# Patient Record
Sex: Male | Born: 1945 | Race: White | Hispanic: No | Marital: Married | Smoking: Former smoker
Health system: Southern US, Community
[De-identification: ages and names within clinical notes are randomized; demographics above are authoritative.]

## PROBLEM LIST (undated history)

## (undated) DIAGNOSIS — J449 Chronic obstructive pulmonary disease, unspecified: Secondary | ICD-10-CM

## (undated) DIAGNOSIS — J45909 Unspecified asthma, uncomplicated: Secondary | ICD-10-CM

## (undated) DIAGNOSIS — I10 Essential (primary) hypertension: Secondary | ICD-10-CM

## (undated) DIAGNOSIS — T8859XA Other complications of anesthesia, initial encounter: Secondary | ICD-10-CM

## (undated) DIAGNOSIS — J189 Pneumonia, unspecified organism: Secondary | ICD-10-CM

## (undated) DIAGNOSIS — K219 Gastro-esophageal reflux disease without esophagitis: Secondary | ICD-10-CM

## (undated) HISTORY — PX: COLONOSCOPY: SHX174

## (undated) HISTORY — DX: Chronic obstructive pulmonary disease, unspecified: J44.9

## (undated) HISTORY — PX: ESOPHAGOGASTRODUODENOSCOPY: SHX1529

## (undated) HISTORY — PX: FRACTURE SURGERY: SHX138

## (undated) HISTORY — PX: TONSILLECTOMY: SUR1361

## (undated) HISTORY — DX: Gastro-esophageal reflux disease without esophagitis: K21.9

---

## 1998-01-12 HISTORY — PX: SINUS EXPLORATION: SHX5214

## 2004-08-01 ENCOUNTER — Ambulatory Visit: Payer: Self-pay | Admitting: Unknown Physician Specialty

## 2005-02-05 ENCOUNTER — Ambulatory Visit: Payer: Self-pay | Admitting: Unknown Physician Specialty

## 2005-02-27 ENCOUNTER — Ambulatory Visit: Payer: Self-pay | Admitting: Unknown Physician Specialty

## 2005-04-18 ENCOUNTER — Emergency Department: Payer: Self-pay | Admitting: Emergency Medicine

## 2009-03-29 ENCOUNTER — Ambulatory Visit: Payer: Self-pay | Admitting: Unknown Physician Specialty

## 2009-04-07 ENCOUNTER — Ambulatory Visit: Payer: Self-pay | Admitting: Internal Medicine

## 2009-04-07 ENCOUNTER — Inpatient Hospital Stay: Payer: Self-pay | Admitting: Internal Medicine

## 2009-12-24 ENCOUNTER — Ambulatory Visit: Payer: Self-pay | Admitting: Internal Medicine

## 2009-12-25 ENCOUNTER — Ambulatory Visit: Payer: Self-pay | Admitting: Internal Medicine

## 2010-02-25 ENCOUNTER — Encounter: Payer: Self-pay | Admitting: Specialist

## 2010-03-13 ENCOUNTER — Encounter: Payer: Self-pay | Admitting: Specialist

## 2010-04-13 ENCOUNTER — Encounter: Payer: Self-pay | Admitting: Specialist

## 2010-05-13 ENCOUNTER — Encounter: Payer: Self-pay | Admitting: Specialist

## 2010-06-13 ENCOUNTER — Encounter: Payer: Self-pay | Admitting: Specialist

## 2010-11-07 ENCOUNTER — Ambulatory Visit: Payer: Self-pay | Admitting: Internal Medicine

## 2011-09-04 ENCOUNTER — Ambulatory Visit: Payer: Self-pay | Admitting: Unknown Physician Specialty

## 2011-09-07 LAB — PATHOLOGY REPORT

## 2011-10-19 ENCOUNTER — Ambulatory Visit: Payer: Self-pay | Admitting: Specialist

## 2011-10-19 LAB — CREATININE, SERUM: EGFR (Non-African Amer.): >60

## 2012-04-18 ENCOUNTER — Ambulatory Visit: Payer: Self-pay | Admitting: Specialist

## 2012-08-25 ENCOUNTER — Ambulatory Visit: Payer: Self-pay

## 2012-11-29 ENCOUNTER — Ambulatory Visit: Payer: Self-pay | Admitting: Specialist

## 2012-12-13 ENCOUNTER — Ambulatory Visit: Payer: Self-pay | Admitting: Specialist

## 2013-06-02 ENCOUNTER — Ambulatory Visit: Payer: Self-pay | Admitting: Unknown Physician Specialty

## 2013-06-14 LAB — PATHOLOGY REPORT

## 2013-06-30 ENCOUNTER — Ambulatory Visit: Payer: Self-pay | Admitting: Internal Medicine

## 2013-07-11 ENCOUNTER — Ambulatory Visit: Payer: Self-pay | Admitting: Surgery

## 2013-07-11 LAB — CBC
HCT: 39.6 % — ABNORMAL LOW (ref 40.0–52.0)
HGB: 13.2 g/dL (ref 13.0–18.0)
MCH: 27.2 pg (ref 26.0–34.0)
MCHC: 33.4 g/dL (ref 32.0–36.0)
MCV: 81 fL (ref 80–100)
Platelet: 207 10*3/uL (ref 150–440)
RBC: 4.87 10*6/uL (ref 4.40–5.90)
RDW: 13.4 % (ref 11.5–14.5)
WBC: 9.1 10*3/uL (ref 3.8–10.6)

## 2013-07-11 LAB — BASIC METABOLIC PANEL
Anion Gap: 5 — ABNORMAL LOW (ref 7–16)
BUN: 13 mg/dL (ref 7–18)
CREATININE: 0.89 mg/dL (ref 0.60–1.30)
Calcium, Total: 8.7 mg/dL (ref 8.5–10.1)
Chloride: 106 mmol/L (ref 98–107)
Co2: 27 mmol/L (ref 21–32)
EGFR (African American): >60
EGFR (Non-African Amer.): >60
Glucose: 86 mg/dL (ref 65–99)
Osmolality: 275 (ref 275–301)
POTASSIUM: 3.9 mmol/L (ref 3.5–5.1)
Sodium: 138 mmol/L (ref 136–145)

## 2014-01-12 HISTORY — PX: HERNIA REPAIR: SHX51

## 2016-04-16 ENCOUNTER — Ambulatory Visit (INDEPENDENT_AMBULATORY_CARE_PROVIDER_SITE_OTHER): Payer: Medicare Other | Admitting: General Surgery

## 2016-04-16 ENCOUNTER — Encounter: Payer: Self-pay | Admitting: General Surgery

## 2016-04-16 VITALS — BP 138/78 | HR 79 | Resp 18 | Ht 69.0 in | Wt 155.0 lb

## 2016-04-16 DIAGNOSIS — Z9981 Dependence on supplemental oxygen: Secondary | ICD-10-CM | POA: Diagnosis not present

## 2016-04-16 DIAGNOSIS — K4090 Unilateral inguinal hernia, without obstruction or gangrene, not specified as recurrent: Secondary | ICD-10-CM

## 2016-04-16 NOTE — Patient Instructions (Addendum)
The patient is aware to call back for any questions or concerns.  Inguinal Hernia, Adult An inguinal hernia is when fat or the intestines push through the area where the leg meets the lower belly (groin) and make a rounded lump (bulge). This condition happens over time. There are three types of inguinal hernias. These types include:  Hernias that can be pushed back into the belly (are reducible).  Hernias that cannot be pushed back into the belly (are incarcerated).  Hernias that cannot be pushed back into the belly and lose their blood supply (get strangulated). This type needs emergency surgery. Follow these instructions at home: Lifestyle   Drink enough fluid to keep your urine (pee) clear or pale yellow.  Eat plenty of fruits, vegetables, and whole grains. These have a lot of fiber. Talk with your doctor if you have questions.  Avoid lifting heavy objects.  Avoid standing for long periods of time.  Do not use tobacco products. These include cigarettes, chewing tobacco, or e-cigarettes. If you need help quitting, ask your doctor.  Try to stay at a healthy weight. General instructions   Do not try to force the hernia back in.  Watch your hernia for any changes in color or size. Let your doctor know if there are any changes.  Take over-the-counter and prescription medicines only as told by your doctor.  Keep all follow-up visits as told by your doctor. This is important. Contact a doctor if:  You have a fever.  You have new symptoms.  Your symptoms get worse. Get help right away if:  The area where the legs meets the lower belly has:  Pain that gets worse suddenly.  A bulge that gets bigger suddenly and does not go down.  A bulge that turns red or purple.  A bulge that is painful to the touch.  You are a man and your scrotum:  Suddenly feels painful.  Suddenly changes in size.  You feel sick to your stomach (nauseous) and this feeling does not go  away.  You throw up (vomit) and this keeps happening.  You feel your heart beating a lot more quickly than normal.  You cannot poop (have a bowel movement) or pass gas. This information is not intended to replace advice given to you by your health care provider. Make sure you discuss any questions you have with your health care provider. Document Released: 01/29/2006 Document Revised: 06/06/2015 Document Reviewed: 11/08/2013 Elsevier Interactive Patient Education  2017 Elsevier Inc.  

## 2016-04-16 NOTE — Progress Notes (Signed)
Patient ID: Erik Underwood, male   DOB: 11-Sep-1945, 71 y.o.   MRN: [id number]  Chief Complaint  Patient presents with  . Other    inguinal hernia    HPI [name] is a 71 y.o. male.  Here today for evaluation of a possible left inguinal hernia. He first noticed this about 3 years ago. He did not have an incident that happened but he does physical work daily. 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  He has not ridden in several years secondary to his oxygen tank.  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 He reports that area has enlarged and the pain has worsened.   The patient reports he had a right inguinal hernia repair under local anesthesia with sedation 2016 completed at [facility].  HPI  Past Medical History:  Diagnosis Date  . COPD (chronic obstructive pulmonary disease) (HCC)   . GERD (gastroesophageal reflux disease)     Past Surgical History:  Procedure Laterality Date  . HERNIA REPAIR Right 2016   [facility]  . SINUS EXPLORATION  2000    Family History  Problem Relation Age of Onset  . Breast cancer Mother   . Breast cancer Sister     Social History Social History  Substance Use Topics  . Smoking status: Former Smoker    Years: 15.00    Quit date: 01/13/1972  . Smokeless tobacco: Never Used  . Alcohol use Yes    No Known Allergies  Current Outpatient Prescriptions  Medication Sig Dispense Refill  . albuterol (PROVENTIL HFA;VENTOLIN HFA) 108 (90 Base) MCG/ACT inhaler Inhale 2 puffs into the lungs every 6 (six) hours as needed.     . fluticasone (FLONASE) 50 MCG/ACT nasal spray Place 2 sprays into both nostrils at bedtime.     . fluticasone furoate-vilanterol (BREO ELLIPTA) 100-25 MCG/INH AEPB Inhale 1 puff into the lungs daily.     . OXYGEN Inhale 4 L into the lungs.    . Red Yeast Rice Extract 600 MG CAPS Take 1,200 mg by mouth 2 (two)  times daily.     . sucralfate (CARAFATE) 1 g tablet Take 1 g by mouth 2 (two) times daily.     Marland Kitchen acetaminophen (TYLENOL) 650 MG CR tablet Take 650 mg by mouth 3 (three) times daily as needed (for pain (patient takes with tramadol)).    . Aclidinium Bromide (TUDORZA PRESSAIR) 400 MCG/ACT AEPB Inhale 1 puff into the lungs 2 (two) times daily.    . B Complex Vitamins (B COMPLEX 100 PO) Take 1 capsule by mouth daily.    . Coenzyme Q10 (CO Q 10) 100 MG CAPS Take 100 mg by mouth daily.    . Ginkgo Biloba (GINKOBA PO) Take 120 mg by mouth daily.    Marland Kitchen GLUCOSAMINE HCL-MSM PO Take 1 tablet by mouth 2 (two) times daily. 1500 mg    . Lecithin 1200 MG CAPS Take 1,200 mg by mouth daily.    Marland Kitchen MAGNESIUM CITRATE PO Take 1 capsule by mouth at bedtime.    . Omega-3 Fatty Acids (FISH OIL) 1000 MG CAPS Take 1,000 mg by mouth daily.    . pantoprazole (PROTONIX) 40 MG tablet Take 40 mg by mouth 2 (two) times daily.    Bertram Gala Glycol-Propyl Glycol (LUBRICANT EYE DROPS) 0.4-0.3 % SOLN Place 1-2 drops into both eyes 3 (  three) times daily as needed (for dry/irritated eyes.).    Marland Kitchen Saw Palmetto, Serenoa repens, (SAW PALMETTO PO) Take 1 capsule by mouth 4 (four) times daily.    . traMADol (ULTRAM) 50 MG tablet Take 25-50 mg by mouth 3 (three) times daily as needed (for pain.).    Marland Kitchen vitamin C (ASCORBIC ACID) 500 MG tablet Take 500 mg by mouth daily.     No current facility-administered medications for this visit.     Review of Systems Review of Systems  Constitutional: Negative.   Respiratory: Negative.   Cardiovascular: Negative.   Gastrointestinal: Negative.     Blood pressure 138/78, pulse 79, resp. rate 18, height  (1.753 m), weight 155 lb (70.3 kg).  Physical Exam Physical Exam  Constitutional: He is oriented to person, place, and time. He appears well-developed and well-nourished.  Cardiovascular: Normal rate, regular rhythm and normal heart sounds.   Pulmonary/Chest: Effort normal and breath  sounds normal.  Distant breath sounds. No wheezes, rales or rhonchi.  Abdominal: Soft. Normal appearance and bowel sounds are normal. There is no tenderness. A hernia is present. Hernia confirmed positive in the left inguinal area.  Genitourinary:     Neurological: He is alert and oriented to person, place, and time.  Skin: Skin is warm and dry.  Psychiatric: He has a normal mood and affect.    Data Reviewed The 08/15/2014 operative report from [facility] was reviewed. The patient had an open inguinal hernia repair, Lichtenstein with prosthetic mesh overlay. Prolene soft mesh. Based bar a direct hernia was identified. Review of the clinic notes reported that the left hernia was larger at the time of initial surgery, but he was more symptomatic on the right side prompting repair.  Pulmonary assessment dated 12/24/2015 by Dr. Mayo Ao reviewed.  GI assessment dated 03/10/2016 reviewed. Past history pancolitis. No evidence of active disease at the time of May 2015 colonoscopy.  Assessment    Symptomatic left inguinal hernia.  Questionable small recurrent right inguinal hernia. Asymptomatic.  Oxygen dependent.    Plan    Elective hernia repair was reviewed. He reported tolerating the procedure under local anesthesia with sedation quite well, and I see no reason not to do the same on the current symptomatic side.  The role of prosthetic mesh was reviewed.    Hernia precautions and incarceration were discussed with the patient. If they develop symptoms of an incarcerated hernia, they were encouraged to seek prompt medical attention. I have recommended repair of the hernia using mesh on an outpatient basis in the near future. The risk of infection was reviewed. The role of prosthetic mesh to minimize the risk of recurrence was reviewed.  HPI, Physical Exam, Assessment and Plan have been scribed under the direction and in the presence of Earline Mayotte, MD  Carron Brazen, LPN  I  have completed the exam and reviewed the above documentation for accuracy and completeness.  I agree with the above.  Museum/gallery conservator has been used and any errors in dictation or transcription are unintentional.  Donnalee Curry, M.D., F.A.C.S.  Earline Mayotte 04/16/2016, 9:04 PM   Patient's surgery has been scheduled for 04-23-16 at [facility]. This will be done under monitored anesthesia care.    Nicholes Mango, CMA

## 2016-04-17 ENCOUNTER — Telehealth: Payer: Self-pay

## 2016-04-17 NOTE — Telephone Encounter (Signed)
Patient notified of need to be seen by Pre Admit Testing prior to surgery. He is scheduled to go to Pre Admit testing at the hospital on 04/20/16 at 10:45 am. He is aware of date, time, and instructions.

## 2016-04-17 NOTE — Telephone Encounter (Signed)
-----   Message from Earline Mayotte, MD sent at 04/16/2016  9:19 PM EDT ----- Patient was originally posted for a phone interview. Lee's arrange for him to go to pre-admit testing so they can lay eyes on him. Thank you

## 2016-04-20 ENCOUNTER — Telehealth: Payer: Self-pay | Admitting: *Deleted

## 2016-04-20 ENCOUNTER — Encounter
Admission: RE | Admit: 2016-04-20 | Discharge: 2016-04-20 | Disposition: A | Discharge status: Home / Self Care | Payer: Medicare Other | Source: Ambulatory Visit | Attending: General Surgery | Admitting: General Surgery

## 2016-04-20 ENCOUNTER — Inpatient Hospital Stay: Admission: RE | Admit: 2016-04-20 | Payer: Self-pay | Source: Ambulatory Visit

## 2016-04-20 DIAGNOSIS — Z79899 Other long term (current) drug therapy: Secondary | ICD-10-CM | POA: Diagnosis not present

## 2016-04-20 DIAGNOSIS — Z87891 Personal history of nicotine dependence: Secondary | ICD-10-CM | POA: Diagnosis not present

## 2016-04-20 DIAGNOSIS — Z7951 Long term (current) use of inhaled steroids: Secondary | ICD-10-CM | POA: Diagnosis not present

## 2016-04-20 DIAGNOSIS — J449 Chronic obstructive pulmonary disease, unspecified: Secondary | ICD-10-CM | POA: Diagnosis not present

## 2016-04-20 DIAGNOSIS — K4090 Unilateral inguinal hernia, without obstruction or gangrene, not specified as recurrent: Secondary | ICD-10-CM | POA: Diagnosis present

## 2016-04-20 DIAGNOSIS — K219 Gastro-esophageal reflux disease without esophagitis: Secondary | ICD-10-CM | POA: Diagnosis not present

## 2016-04-20 DIAGNOSIS — Z9981 Dependence on supplemental oxygen: Secondary | ICD-10-CM | POA: Diagnosis not present

## 2016-04-20 LAB — CBC
HCT: 41.7 % (ref 40.0–52.0)
Hemoglobin: 13.9 g/dL (ref 13.0–18.0)
MCH: 26.1 pg (ref 26.0–34.0)
MCHC: 33.4 g/dL (ref 32.0–36.0)
MCV: 78.2 fL — AB (ref 80.0–100.0)
PLATELETS: 243 10*3/uL (ref 150–440)
RBC: 5.33 MIL/uL (ref 4.40–5.90)
RDW: 14.1 % (ref 11.5–14.5)
WBC: 7.8 10*3/uL (ref 3.8–10.6)

## 2016-04-20 NOTE — Telephone Encounter (Signed)
LYNN S.([phone]) AT [facility] (PATIENT'S PCP OF[facility]) CALLED TO LET us KNOW THE PATIENT IS COMING IN TOMORROW FOR APPOINTMENT. THE LETTER SHOULD BE SENT FOR CLEARANCE.

## 2016-04-20 NOTE — Pre-Procedure Instructions (Signed)
Faxed request for clearance to patient PMD at [facility] (and Marcelino Duster at Dr Rutherford Nail office).

## 2016-04-20 NOTE — Telephone Encounter (Signed)
Per Cordelia Pen with Pre-admission Testing, patient will require medical clearance prior to left open inguinal hernia repair that is scheduled at [facility] for 04-23-16.  Message left at primary care physician's office to make sure they received medical clearance request from anesthesia.   Patient also contacted today and made aware of need for clearance.

## 2016-04-20 NOTE — Patient Instructions (Signed)
Your procedure is scheduled on: Thursday 04/23/16 Report to DAY SURGERY. 2ND FLOOR MEDICAL MALL ENTRANCE. To find out your arrival time please call [phone] between 1PM - 3PM on Wednesday 04/22/16.  Remember: Instructions that are not followed completely may result in serious medical risk, up to and including death, or upon the discretion of your surgeon and anesthesiologist your surgery may need to be rescheduled.    __X__ 1. Do not eat food or drink liquids after midnight. No gum chewing or hard candies.     __X__ 2. No Alcohol for 24 hours before or after surgery.   ____ 3. Bring all medications with you on the day of surgery if instructed.    __X__ 4. Notify your doctor if there is any change in your medical condition     (cold, fever, infections).             __X___5. No smoking within 24 hours of your surgery.     Do not wear jewelry, make-up, hairpins, clips or nail polish.  Do not wear lotions, powders, or perfumes.   Do not shave 48 hours prior to surgery. Men may shave face and neck.  Do not bring valuables to the hospital.    [facility] is not responsible for any belongings or valuables.               Contacts, dentures or bridgework may not be worn into surgery.  Leave your suitcase in the car. After surgery it may be brought to your room.  For patients admitted to the hospital, discharge time is determined by your                treatment team.   Patients discharged the day of surgery will not be allowed to drive home.   Please read over the following fact sheets that you were given:   Pain Booklet and MRSA Information   __X__ Take these medicines the morning of surgery with A SIP OF WATER:    1. PANTOPRAZOLE  2.   3.   4.  5.  6.  ____ Fleet Enema (as directed)   __X__ Use CHG Soap as directed  __X__ Use inhalers on the day of surgery ALSO USE AND BRING ALBUTEROL INHALER  ____ Stop metformin 2 days prior to surgery    ____ Take 1/2 of usual insulin  dose the night before surgery and none on the morning of surgery.   ____ Stop Coumadin/Plavix/aspirin on   __X__ Stop Anti-inflammatories such as Advil, Aleve, Ibuprofen, Motrin, Naproxen, Naprosyn, Goodies,powder, or aspirin products.  OK to take Tylenol.   __X__ Stop supplements until after surgery.    ____ Bring C-Pap to the hospital.

## 2016-04-22 MED ORDER — CEFAZOLIN SODIUM-DEXTROSE 2-4 GM/100ML-% IV SOLN
2.0000 g | INTRAVENOUS | Status: AC
  Administered 2016-04-23: 2 g via INTRAVENOUS

## 2016-04-22 NOTE — Telephone Encounter (Signed)
We received pulmonary clearance from Dr. Meredeth Ide for patient's surgery that is scheduled for tomorrow.   Clearance has been faxed to the pre-admission testing department and received per [place]. Also, faxed to Same Day Surgery and received per [facility].   Message left on patient's home number that we can proceed tomorrow as scheduled.

## 2016-04-22 NOTE — Telephone Encounter (Signed)
Per patient's PCP, he is cleared at moderate risk for surgery but is recommending to consider consulting with patient's pulmonologist to ensure pulmonary status is optimized.   Patient was contacted this morning informing him of the above. This patient states he was seen 3 months ago by Dr. Meredeth Ide. Next appointment is not scheduled until 05-05-16. Clearance request has been faxed to Dr. Reita Cliche office to see if he will clear patient based upon last office visit or if he will need to be seen prior to surgery.

## 2016-04-22 NOTE — Pre-Procedure Instructions (Signed)
CLEARED BY DR Meredeth Ide

## 2016-04-23 ENCOUNTER — Encounter: Payer: Self-pay | Admitting: *Deleted

## 2016-04-23 ENCOUNTER — Ambulatory Visit: Payer: Medicare Other | Admitting: Anesthesiology

## 2016-04-23 ENCOUNTER — Encounter
Admission: RE | Disposition: A | Discharge status: Home / Self Care | Payer: Self-pay | Source: Ambulatory Visit | Attending: General Surgery

## 2016-04-23 ENCOUNTER — Ambulatory Visit
Admission: RE | Admit: 2016-04-23 | Discharge: 2016-04-23 | Disposition: A | Discharge status: Home / Self Care | Payer: Medicare Other | Source: Ambulatory Visit | Attending: General Surgery | Admitting: General Surgery

## 2016-04-23 DIAGNOSIS — J449 Chronic obstructive pulmonary disease, unspecified: Secondary | ICD-10-CM | POA: Insufficient documentation

## 2016-04-23 DIAGNOSIS — K4090 Unilateral inguinal hernia, without obstruction or gangrene, not specified as recurrent: Secondary | ICD-10-CM | POA: Diagnosis not present

## 2016-04-23 DIAGNOSIS — Z9981 Dependence on supplemental oxygen: Secondary | ICD-10-CM | POA: Insufficient documentation

## 2016-04-23 DIAGNOSIS — K219 Gastro-esophageal reflux disease without esophagitis: Secondary | ICD-10-CM | POA: Insufficient documentation

## 2016-04-23 DIAGNOSIS — Z7951 Long term (current) use of inhaled steroids: Secondary | ICD-10-CM | POA: Insufficient documentation

## 2016-04-23 DIAGNOSIS — Z79899 Other long term (current) drug therapy: Secondary | ICD-10-CM | POA: Insufficient documentation

## 2016-04-23 DIAGNOSIS — Z87891 Personal history of nicotine dependence: Secondary | ICD-10-CM | POA: Insufficient documentation

## 2016-04-23 HISTORY — PX: INGUINAL HERNIA REPAIR: SHX194

## 2016-04-23 SURGERY — REPAIR, HERNIA, INGUINAL, ADULT
Anesthesia: Monitor Anesthesia Care | Laterality: Left | Wound class: Clean

## 2016-04-23 MED ORDER — PROPOFOL 500 MG/50ML IV EMUL
INTRAVENOUS | Status: AC
  Filled 2016-04-23: qty 50

## 2016-04-23 MED ORDER — LACTATED RINGERS IV SOLN
INTRAVENOUS | Status: DC
  Administered 2016-04-23: 07:00:00 via INTRAVENOUS

## 2016-04-23 MED ORDER — PROPOFOL 500 MG/50ML IV EMUL
INTRAVENOUS | Status: DC | PRN
  Administered 2016-04-23: 50 ug/kg/min via INTRAVENOUS

## 2016-04-23 MED ORDER — KETOROLAC TROMETHAMINE 30 MG/ML IJ SOLN
INTRAMUSCULAR | Status: AC
  Filled 2016-04-23: qty 1

## 2016-04-23 MED ORDER — LACTATED RINGERS IV SOLN: INTRAVENOUS | Status: DC

## 2016-04-23 MED ORDER — LIDOCAINE-EPINEPHRINE (PF) 1 %-1:200000 IJ SOLN
INTRAMUSCULAR | Status: DC | PRN
  Administered 2016-04-23: 30 mL

## 2016-04-23 MED ORDER — BUPIVACAINE-EPINEPHRINE (PF) 0.5% -1:200000 IJ SOLN
INTRAMUSCULAR | Status: AC
  Filled 2016-04-23: qty 30

## 2016-04-23 MED ORDER — BUPIVACAINE-EPINEPHRINE (PF) 0.5% -1:200000 IJ SOLN
INTRAMUSCULAR | Status: DC | PRN
  Administered 2016-04-23: 30 mL

## 2016-04-23 MED ORDER — MIDAZOLAM HCL 2 MG/2ML IJ SOLN
INTRAMUSCULAR | Status: DC | PRN
  Administered 2016-04-23: 1 mg via INTRAVENOUS

## 2016-04-23 MED ORDER — FENTANYL CITRATE (PF) 100 MCG/2ML IJ SOLN: 25.0000 ug | INTRAMUSCULAR | Status: DC | PRN

## 2016-04-23 MED ORDER — SODIUM BICARBONATE 4 % IV SOLN
INTRAVENOUS | Status: AC
  Filled 2016-04-23: qty 5

## 2016-04-23 MED ORDER — MIDAZOLAM HCL 2 MG/2ML IJ SOLN
INTRAMUSCULAR | Status: AC
  Filled 2016-04-23: qty 2

## 2016-04-23 MED ORDER — SODIUM BICARBONATE 4 % IV SOLN
INTRAVENOUS | Status: DC | PRN
  Administered 2016-04-23: 5 mL via SUBCUTANEOUS

## 2016-04-23 MED ORDER — FENTANYL CITRATE (PF) 100 MCG/2ML IJ SOLN
INTRAMUSCULAR | Status: DC | PRN
  Administered 2016-04-23: 25 ug via INTRAVENOUS
  Administered 2016-04-23: 50 ug via INTRAVENOUS
  Administered 2016-04-23 (×2): 25 ug via INTRAVENOUS

## 2016-04-23 MED ORDER — ACETAMINOPHEN 10 MG/ML IV SOLN
INTRAVENOUS | Status: DC | PRN
  Administered 2016-04-23: 1000 mg via INTRAVENOUS

## 2016-04-23 MED ORDER — PROPOFOL 10 MG/ML IV BOLUS
INTRAVENOUS | Status: DC | PRN
  Administered 2016-04-23 (×2): 20 mg via INTRAVENOUS

## 2016-04-23 MED ORDER — ONDANSETRON HCL 4 MG/2ML IJ SOLN: 4.0000 mg | Freq: Once | INTRAMUSCULAR | Status: DC | PRN

## 2016-04-23 MED ORDER — ACETAMINOPHEN 10 MG/ML IV SOLN
INTRAVENOUS | Status: AC
  Filled 2016-04-23: qty 100

## 2016-04-23 MED ORDER — FENTANYL CITRATE (PF) 100 MCG/2ML IJ SOLN
INTRAMUSCULAR | Status: AC
  Filled 2016-04-23: qty 2

## 2016-04-23 MED ORDER — PHENYLEPHRINE HCL 10 MG/ML IJ SOLN
INTRAMUSCULAR | Status: AC
  Filled 2016-04-23: qty 1

## 2016-04-23 MED ORDER — CEFAZOLIN SODIUM-DEXTROSE 2-4 GM/100ML-% IV SOLN
INTRAVENOUS | Status: AC
  Filled 2016-04-23: qty 100

## 2016-04-23 MED ORDER — LIDOCAINE-EPINEPHRINE (PF) 1 %-1:200000 IJ SOLN
INTRAMUSCULAR | Status: AC
Start: 2016-04-23 — End: 2016-04-23
  Filled 2016-04-23: qty 30

## 2016-04-23 SURGICAL SUPPLY — 33 items
BLADE SURG 15 STRL SS SAFETY (BLADE) ×6 IMPLANT
CANISTER SUCT 1200ML W/VALVE (MISCELLANEOUS) ×3 IMPLANT
CHLORAPREP W/TINT 26ML (MISCELLANEOUS) ×3 IMPLANT
CLOSURE WOUND 1/2 X4 (GAUZE/BANDAGES/DRESSINGS) ×1
DECANTER SPIKE VIAL GLASS SM (MISCELLANEOUS) ×6 IMPLANT
DRAIN PENROSE 1/4X12 LTX (DRAIN) ×3 IMPLANT
DRAPE LAPAROTOMY 100X77 ABD (DRAPES) ×3 IMPLANT
DRSG TEGADERM 4X4.75 (GAUZE/BANDAGES/DRESSINGS) ×3 IMPLANT
DRSG TELFA 4X3 1S NADH ST (GAUZE/BANDAGES/DRESSINGS) ×3 IMPLANT
ELECT REM PT RETURN 9FT ADLT (ELECTROSURGICAL) ×3
ELECTRODE REM PT RTRN 9FT ADLT (ELECTROSURGICAL) ×1 IMPLANT
GLOVE BIO SURGEON STRL SZ7.5 (GLOVE) ×9 IMPLANT
GLOVE INDICATOR 8.0 STRL GRN (GLOVE) ×6 IMPLANT
GOWN STRL REUS W/ TWL LRG LVL3 (GOWN DISPOSABLE) ×2 IMPLANT
GOWN STRL REUS W/TWL LRG LVL3 (GOWN DISPOSABLE) ×4
KIT RM TURNOVER STRD PROC AR (KITS) ×3 IMPLANT
LABEL OR SOLS (LABEL) ×3 IMPLANT
MESH HERNIA SYS ULTRAPRO LRG (Mesh General) ×3 IMPLANT
NDL SAFETY 22GX1.5 (NEEDLE) ×6 IMPLANT
NEEDLE HYPO 25X1 1.5 SAFETY (NEEDLE) ×3 IMPLANT
PACK BASIN MINOR ARMC (MISCELLANEOUS) ×3 IMPLANT
STRIP CLOSURE SKIN 1/2X4 (GAUZE/BANDAGES/DRESSINGS) ×2 IMPLANT
SUT SURGILON 0 BLK (SUTURE) ×3 IMPLANT
SUT VIC AB 2-0 SH 27 (SUTURE) ×2
SUT VIC AB 2-0 SH 27XBRD (SUTURE) ×1 IMPLANT
SUT VIC AB 3-0 54X BRD REEL (SUTURE) ×1 IMPLANT
SUT VIC AB 3-0 BRD 54 (SUTURE) ×2
SUT VIC AB 3-0 SH 27 (SUTURE) ×2
SUT VIC AB 3-0 SH 27X BRD (SUTURE) ×1 IMPLANT
SUT VIC AB 4-0 FS2 27 (SUTURE) ×3 IMPLANT
SWABSTK COMLB BENZOIN TINCTURE (MISCELLANEOUS) ×3 IMPLANT
SYR 3ML LL SCALE MARK (SYRINGE) ×3 IMPLANT
SYR CONTROL 10ML (SYRINGE) ×6 IMPLANT

## 2016-04-23 NOTE — Anesthesia Procedure Notes (Addendum)
Procedure Name: MAC Performed by: Lance Muss Pre-anesthesia Checklist: Patient identified, Emergency Drugs available, Suction available, Patient being monitored and Timeout performed Patient Re-evaluated:Patient Re-evaluated prior to inductionOxygen Delivery Method: Simple face mask Preoxygenation: Pre-oxygenation with 100% oxygen Ventilation: Nasal airway inserted- appropriate to patient size

## 2016-04-23 NOTE — Transfer of Care (Signed)
Immediate Anesthesia Transfer of Care Note  Patient: Erik Underwood  Procedure(s) Performed: Procedure(s): HERNIA REPAIR INGUINAL ADULT (Left)  Patient Location: PACU  Anesthesia Type:MAC  Level of Consciousness: awake and responds to stimulation  Airway & Oxygen Therapy: Patient Spontanous Breathing and Patient connected to face mask oxygen  Post-op Assessment: Report given to RN and Post -op Vital signs reviewed and stable  Post vital signs: Reviewed and stable  Last Vitals:  Vitals:   04/23/16 0704 04/23/16 0956  BP: 129/84 136/71  Pulse: (!) 59 64  Resp: 16 12  Temp: 36.2 C     Last Pain:  Vitals:   04/23/16 0704  TempSrc: Tympanic  PainSc: 4          Complications: No apparent anesthesia complications

## 2016-04-23 NOTE — Progress Notes (Signed)
Patient has some red rash area to surgical site After clipping his hair, will notify Dr. Lemar Livings.

## 2016-04-23 NOTE — H&P (Signed)
No change in clinical condition or exam.  For left inguinal hernia repair.  

## 2016-04-23 NOTE — Anesthesia Post-op Follow-up Note (Cosign Needed)
Anesthesia QCDR form completed.        

## 2016-04-23 NOTE — Anesthesia Preprocedure Evaluation (Signed)
Anesthesia Evaluation  Patient identified by MRN, date of birth, ID band Patient awake    Reviewed: Allergy & Precautions, NPO status , Patient's Chart, lab work & pertinent test results  Airway Mallampati: II  TM Distance: >3 FB     Dental  (+) Caps   Pulmonary COPD,  COPD inhaler and oxygen dependent, former smoker,     (-) wheezing      Cardiovascular negative cardio ROS Normal cardiovascular exam     Neuro/Psych negative neurological ROS  negative psych ROS   GI/Hepatic Neg liver ROS, GERD  Medicated and Controlled,  Endo/Other  negative endocrine ROS  Renal/GU negative Renal ROS  negative genitourinary   Musculoskeletal negative musculoskeletal ROS (+)   Abdominal Normal abdominal exam  (+)   Peds negative pediatric ROS (+)  Hematology negative hematology ROS (+)   Anesthesia Other Findings Past Medical History: No date: COPD (chronic obstructive pulmonary disease) (* No date: GERD (gastroesophageal reflux disease)  Reproductive/Obstetrics                             Anesthesia Physical Anesthesia Plan  ASA: IV  Anesthesia Plan: MAC and General   Post-op Pain Management:    Induction: Intravenous  Airway Management Planned: Nasal Cannula  Additional Equipment:   Intra-op Plan:   Post-operative Plan:   Informed Consent: I have reviewed the patients History and Physical, chart, labs and discussed the procedure including the risks, benefits and alternatives for the proposed anesthesia with the patient or authorized representative who has indicated his/her understanding and acceptance.     Plan Discussed with: CRNA and Surgeon  Anesthesia Plan Comments:         Anesthesia Quick Evaluation

## 2016-04-23 NOTE — Discharge Instructions (Signed)
Follow up April 23 at [facility]

## 2016-04-23 NOTE — Op Note (Addendum)
Preoperative diagnosis: Symptomatic left inguinal hernia.  Postoperative diagnosis: Same.  Operative procedure: Left direct inguinal hernia repair with large Ultra Pro mesh.  Operating surgeon: Lane Hacker, M.D.  Anesthesia: Attended local, 65 mL 0.5% Xylocaine with 0.25% Marcaine with 1-200,000 units of epinephrine and sodium bicarbonate.  Estimated blood loss: Less than 1 mL.  Clinical note: This 71 year old oxygen-dependent male has developed a left inguinal hernia. He is previously undergone right inguinal hernia repair under local with sedation at [facility]. He was cleared by his pulmonologist for surgery. His desire was for local anesthesia with sedation. The area was clipped prior to procedure with moderate abrasions on the skin. He received Kefzol prior to procedure. SCD stockings were in place for DVT prevention.   Operative note: With the patient adequately sedated the area was prepped with ChloraPrep and draped. Field block anesthesia was established and well tolerated. A 5 cm skin incision along the anticipated course the inguinal canal was carried aphthous consulting this tissue with hemostasis G bilateral cautery and 3-0 Vicryl ties. The external Bleich was opened in the direction of its fibers. A large direct hernia was noted. The cord was dissected with no evidence of an indirect sac or lipoma. The covering of the direct sac was excised with cautery and discarded. The preperitoneal space was cleared and a large Ultra Pro mesh smooth and position. The external component was laid along the floor the canal. This was anchored to the pubic tubercle along the inguinal ligament with interrupted 0 Surgilon sutures. The medial and superior borders were anchored to the transverse abdominis aponeurosis. The ilioinguinal and iliohypogastric nerves were identified and protected. The cord was returned to its bed and 30 mg of Toradol placed into the wound. The external Bleich was closed with a  running 2-0 Vicryl suture. Scarpa's fascia was approximated with a running 3-0 Vicryl suture. The skin was closed with a running 4-0 Vicryl subcuticular suture. Benzoin, Steri-Strips, Telfa and Tegaderm dressings were applied.  Patient tolerated the procedure well was taken to recovery in stable condition.

## 2016-04-24 ENCOUNTER — Encounter: Payer: Self-pay | Admitting: General Surgery

## 2016-04-24 NOTE — Anesthesia Postprocedure Evaluation (Signed)
Anesthesia Post Note  Patient: Erik Underwood  Procedure(s) Performed: Procedure(s) (LRB): HERNIA REPAIR INGUINAL ADULT (Left)  Patient location during evaluation: PACU Anesthesia Type: MAC Level of consciousness: awake and alert and oriented Pain management: pain level controlled Vital Signs Assessment: post-procedure vital signs reviewed and stable Respiratory status: spontaneous breathing Cardiovascular status: blood pressure returned to baseline Anesthetic complications: no     Last Vitals:  Vitals:   04/23/16 1034 04/23/16 1100  BP: (!) 157/75 130/71  Pulse: (!) 55 (!) 54  Resp: 16   Temp: (!) 36 C     Last Pain:  Vitals:   04/24/16 0844  TempSrc:   PainSc: 1                  [name]

## 2016-05-04 ENCOUNTER — Encounter: Payer: Self-pay | Admitting: General Surgery

## 2016-05-04 ENCOUNTER — Ambulatory Visit (INDEPENDENT_AMBULATORY_CARE_PROVIDER_SITE_OTHER): Payer: Medicare Other | Admitting: General Surgery

## 2016-05-04 VITALS — BP 124/64 | HR 88 | Resp 20 | Ht 69.0 in | Wt 155.0 lb

## 2016-05-04 DIAGNOSIS — K4090 Unilateral inguinal hernia, without obstruction or gangrene, not specified as recurrent: Secondary | ICD-10-CM

## 2016-05-04 NOTE — Patient Instructions (Signed)
The patient is aware to call back for any questions or concerns. Proper lifting techniques reviewed.

## 2016-05-04 NOTE — Progress Notes (Signed)
Patient ID: Erik Underwood, male   DOB: February 09, 1945, 71 y.o.   MRN: [id number]  Chief Complaint  Patient presents with  . Routine Post Op    inguinal hernia    HPI Erik Underwood is a 71 y.o. male here today for his post op left inguinal hernia repair done on 04/23/2016. Patient states he is doing well just sore.  HPI  Past Medical History:  Diagnosis Date  . COPD (chronic obstructive pulmonary disease) (HCC)   . GERD (gastroesophageal reflux disease)     Past Surgical History:  Procedure Laterality Date  . FRACTURE SURGERY    . HERNIA REPAIR Right 2016   [facility]  . INGUINAL HERNIA REPAIR Left 04/23/2016   Procedure: HERNIA REPAIR INGUINAL ADULT;  Surgeon: Earline Mayotte, MD;  Location: [facility] ORS;  Service: General;  Laterality: Left;  . SINUS EXPLORATION  2000  . TONSILLECTOMY      Family History  Problem Relation Age of Onset  . Breast cancer Mother   . Breast cancer Sister     Social History Social History  Substance Use Topics  . Smoking status: Former Smoker    Years: 15.00    Quit date: 01/13/1972  . Smokeless tobacco: Never Used  . Alcohol use 4.2 oz/week    7 Shots of liquor per week    No Known Allergies  Current Outpatient Prescriptions  Medication Sig Dispense Refill  . acetaminophen (TYLENOL) 650 MG CR tablet Take 650 mg by mouth 3 (three) times daily as needed (for pain (patient takes with tramadol)).    . Aclidinium Bromide (TUDORZA PRESSAIR) 400 MCG/ACT AEPB Inhale 1 puff into the lungs 2 (two) times daily.    Marland Kitchen albuterol (PROVENTIL HFA;VENTOLIN HFA) 108 (90 Base) MCG/ACT inhaler Inhale 2 puffs into the lungs every 6 (six) hours as needed.     . B Complex Vitamins (B COMPLEX 100 PO) Take 1 capsule by mouth daily.    . Coenzyme Q10 (CO Q 10) 100 MG CAPS Take 100 mg by mouth daily.    . fluticasone (FLONASE) 50 MCG/ACT nasal spray Place 2 sprays into both nostrils at bedtime.     . fluticasone furoate-vilanterol (BREO ELLIPTA) 100-25 MCG/INH AEPB Inhale  1 puff into the lungs daily.     . Ginkgo Biloba (GINKOBA PO) Take 120 mg by mouth daily.    Marland Kitchen GLUCOSAMINE HCL-MSM PO Take 1 tablet by mouth 2 (two) times daily. 1500 mg    . Lecithin 1200 MG CAPS Take 1,200 mg by mouth daily.    Marland Kitchen MAGNESIUM CITRATE PO Take 1 capsule by mouth at bedtime.    . Omega-3 Fatty Acids (FISH OIL) 1000 MG CAPS Take 1,000 mg by mouth daily.    . OXYGEN Inhale 4 L into the lungs.    . pantoprazole (PROTONIX) 40 MG tablet Take 40 mg by mouth 2 (two) times daily.    Bertram Gala Glycol-Propyl Glycol (LUBRICANT EYE DROPS) 0.4-0.3 % SOLN Place 1-2 drops into both eyes 3 (three) times daily as needed (for dry/irritated eyes.).    Marland Kitchen Red Yeast Rice Extract 600 MG CAPS Take 1,200 mg by mouth 2 (two) times daily.     . Saw Palmetto, Serenoa repens, (SAW PALMETTO PO) Take 1 capsule by mouth 4 (four) times daily.    . sucralfate (CARAFATE) 1 g tablet Take 1 g by mouth 2 (two) times daily.     . traMADol (ULTRAM) 50 MG tablet Take 25-50 mg by mouth 3 (  three) times daily as needed (for pain.).    Marland Kitchen vitamin C (ASCORBIC ACID) 500 MG tablet Take 500 mg by mouth daily.     No current facility-administered medications for this visit.     Review of Systems Review of Systems  Blood pressure 124/64, pulse 88, resp. rate 20, height  (1.753 m), weight 155 lb (70.3 kg).  Physical Exam Physical Exam  Constitutional: He is oriented to person, place, and time. He appears well-developed and well-nourished.  Abdominal: No hernia.  Left inguinal hernia repair is intact and healing well.   Genitourinary:     Neurological: He is alert and oriented to person, place, and time.  Skin: Skin is warm and dry.       Assessment    Doing well status post left inguinal hernia repair.    Plan        Patient to return as needed.  The patient is aware to call back for any questions or concerns. Proper lifting techniques reviewed.  HPI, Physical Exam, Assessment and Plan have been  scribed under the direction and in the presence of Donnalee Curry, MD.  Ples Specter, CMA  I have completed the exam and reviewed the above documentation for accuracy and completeness.  I agree with the above.  Museum/gallery conservator has been used and any errors in dictation or transcription are unintentional.  Donnalee Curry, M.D., F.A.C.S.   Earline Mayotte 05/05/2016, 8:39 AM

## 2016-05-05 ENCOUNTER — Encounter: Payer: Self-pay | Admitting: General Surgery

## 2018-02-18 ENCOUNTER — Other Ambulatory Visit: Payer: Self-pay | Admitting: Specialist

## 2018-02-18 DIAGNOSIS — R918 Other nonspecific abnormal finding of lung field: Secondary | ICD-10-CM

## 2018-02-23 ENCOUNTER — Ambulatory Visit: Payer: Medicare Other

## 2018-03-22 ENCOUNTER — Encounter (INDEPENDENT_AMBULATORY_CARE_PROVIDER_SITE_OTHER): Payer: Self-pay

## 2018-03-22 ENCOUNTER — Ambulatory Visit
Admission: RE | Admit: 2018-03-22 | Discharge: 2018-03-22 | Disposition: A | Discharge status: Home / Self Care | Payer: Medicare Other | Source: Ambulatory Visit | Attending: Specialist | Admitting: Specialist

## 2018-03-22 DIAGNOSIS — R918 Other nonspecific abnormal finding of lung field: Secondary | ICD-10-CM | POA: Diagnosis present

## 2019-05-19 ENCOUNTER — Other Ambulatory Visit: Payer: Self-pay | Admitting: Specialist

## 2019-05-19 DIAGNOSIS — R0609 Other forms of dyspnea: Secondary | ICD-10-CM

## 2019-05-19 DIAGNOSIS — R918 Other nonspecific abnormal finding of lung field: Secondary | ICD-10-CM

## 2019-05-26 ENCOUNTER — Other Ambulatory Visit: Payer: Self-pay

## 2019-05-26 ENCOUNTER — Other Ambulatory Visit
Admission: RE | Admit: 2019-05-26 | Discharge: 2019-05-26 | Disposition: A | Discharge status: Home / Self Care | Payer: Medicare PPO | Source: Ambulatory Visit | Attending: Specialist | Admitting: Specialist

## 2019-05-26 ENCOUNTER — Ambulatory Visit
Admission: RE | Admit: 2019-05-26 | Discharge: 2019-05-26 | Disposition: A | Discharge status: Home / Self Care | Payer: Medicare PPO | Source: Ambulatory Visit | Attending: Specialist | Admitting: Specialist

## 2019-05-26 ENCOUNTER — Encounter (INDEPENDENT_AMBULATORY_CARE_PROVIDER_SITE_OTHER): Payer: Self-pay

## 2019-05-26 DIAGNOSIS — R918 Other nonspecific abnormal finding of lung field: Secondary | ICD-10-CM | POA: Insufficient documentation

## 2019-05-26 DIAGNOSIS — R0600 Dyspnea, unspecified: Secondary | ICD-10-CM | POA: Insufficient documentation

## 2019-05-26 DIAGNOSIS — R0609 Other forms of dyspnea: Secondary | ICD-10-CM

## 2019-05-26 HISTORY — DX: Unspecified asthma, uncomplicated: J45.909

## 2019-05-26 LAB — CREATININE, SERUM
Creatinine, Ser: 0.98 mg/dL (ref 0.61–1.24)
GFR calc Af Amer: >60 mL/min
GFR calc non Af Amer: >60 mL/min

## 2019-05-26 LAB — BUN: BUN: 22 mg/dL (ref 8–23)

## 2019-05-26 MED ORDER — IOHEXOL 300 MG/ML  SOLN
75.0000 mL | Freq: Once | INTRAMUSCULAR | Status: AC | PRN
  Administered 2019-05-26: 75 mL via INTRAVENOUS

## 2019-07-13 ENCOUNTER — Other Ambulatory Visit (HOSPITAL_COMMUNITY): Payer: Self-pay | Admitting: Specialist

## 2019-07-13 ENCOUNTER — Other Ambulatory Visit: Payer: Self-pay | Admitting: Specialist

## 2019-07-13 DIAGNOSIS — R918 Other nonspecific abnormal finding of lung field: Secondary | ICD-10-CM

## 2019-07-13 DIAGNOSIS — J849 Interstitial pulmonary disease, unspecified: Secondary | ICD-10-CM

## 2019-08-08 ENCOUNTER — Other Ambulatory Visit: Payer: Self-pay

## 2019-08-08 ENCOUNTER — Ambulatory Visit
Admission: RE | Admit: 2019-08-08 | Discharge: 2019-08-08 | Disposition: A | Discharge status: Home / Self Care | Payer: Medicare PPO | Source: Ambulatory Visit | Attending: Specialist | Admitting: Specialist

## 2019-08-08 DIAGNOSIS — J849 Interstitial pulmonary disease, unspecified: Secondary | ICD-10-CM | POA: Diagnosis not present

## 2019-08-08 DIAGNOSIS — R918 Other nonspecific abnormal finding of lung field: Secondary | ICD-10-CM | POA: Insufficient documentation

## 2020-03-07 ENCOUNTER — Other Ambulatory Visit: Payer: Self-pay

## 2020-03-07 ENCOUNTER — Other Ambulatory Visit
Admission: RE | Admit: 2020-03-07 | Discharge: 2020-03-07 | Disposition: A | Discharge status: Home / Self Care | Payer: Medicare PPO | Source: Ambulatory Visit | Attending: Otolaryngology | Admitting: Otolaryngology

## 2020-03-07 HISTORY — DX: Essential (primary) hypertension: I10

## 2020-03-07 HISTORY — DX: Other complications of anesthesia, initial encounter: T88.59XA

## 2020-03-07 HISTORY — DX: Pneumonia, unspecified organism: J18.9

## 2020-03-07 NOTE — Patient Instructions (Addendum)
Your procedure is scheduled on: 03/13/2020- Wednesday Report to the Registration Desk on the 1st floor of the [facility]. To find out your arrival time, please call [phone] between 1PM - 3PM on: 03/12/2020- Tuesday  REMEMBER: Instructions that are not followed completely may result in serious medical risk, up to and including death; or upon the discretion of your surgeon and anesthesiologist your surgery may need to be rescheduled.  Do not eat food after midnight the night before surgery.  No gum chewing, lozengers or hard candies.  You may however, drink CLEAR liquids up to 2 hours before you are scheduled to arrive for your surgery. Do not drink anything within 2 hours of your scheduled arrival time.  Clear liquids include: - water  - apple juice without pulp - gatorade (not RED, PURPLE, OR BLUE) - black coffee or tea (Do NOT add milk or creamers to the coffee or tea) Do NOT drink anything that is not on this list.  Type 1 and Type 2 diabetics should only drink water.   TAKE THESE MEDICATIONS THE MORNING OF SURGERY WITH A SIP OF WATER:  - pantoprazole (PROTONIX) 40 MG tablet, take one the night before and one on the morning of surgery - helps to prevent nausea after    surgery. - TRELEGY ELLIPTA 100-62.5-25 MCG/INH AEPB - OXYGEN  Inhale 6 L into the lungs continuous.  Use inhaler albuterol (PROVENTIL HFA;VENTOLIN HFA) 108 (90 Base) MCG/ACT inhaler on the day of surgery and bring to the hospital.  One week prior to surgery: Stop Anti-inflammatories (NSAIDS) such as Advil, Aleve, Ibuprofen, Motrin, Naproxen, Naprosyn and Aspirin based products such as Excedrin, Goodys Powder, BC Powder.  Stop ANY OVER THE COUNTER supplements until after surgery starting 03/07/20 .Coenzyme Q10 (CO Q 10) 100 MG CAPS,COLLAGEN PO ,GLUCOSAMINE HCL-MSM PO,Lecithin 1200 MG CAPS,Omega-3 Fatty Acids (FISH OIL) 1000 MG CAPS,Red Yeast Rice Extract 600 MG CAPS, Turmeric 500 MG CAPS,vitamin C (ASCORBIC  ACID) 500 MG tablet.   No Alcohol for 24 hours before or after surgery  No Smoking including e-cigarettes for 24 hours prior to surgery.  No chewable tobacco products for at least 6 hours prior to surgery.  No nicotine patches on the day of surgery.  Do not use any "recreational" drugs for at least a week prior to your surgery.  Please be advised that the combination of cocaine and anesthesia may have negative outcomes, up to and including death. If you test positive for cocaine, your surgery will be cancelled.  On the morning of surgery brush your teeth with toothpaste and water, you may rinse your mouth with mouthwash if you wish. Do not swallow any toothpaste or mouthwash.  Do not wear jewelry, make-up, hairpins, clips or nail polish.  Do not wear lotions, powders, or perfumes.   Do not shave body from the neck down 48 hours prior to surgery just in case you cut yourself which could leave a site for infection.  Also, freshly shaved skin may become irritated if using the CHG soap.  Contact lenses, hearing aids and dentures may not be worn into surgery.  Do not bring valuables to the hospital. [facility] is not responsible for any missing/lost belongings or valuables.   Notify your doctor if there is any change in your medical condition (cold, fever, infection).  Wear comfortable clothing (specific to your surgery type) to the hospital.  Plan for stool softeners for home use; pain medications have a tendency to cause constipation. You can also  help prevent constipation by eating foods high in fiber such as fruits and vegetables and drinking plenty of fluids as your diet allows.  After surgery, you can help prevent lung complications by doing breathing exercises.  Take deep breaths and cough every 1-2 hours. Your doctor may order a device called an Incentive Spirometer to help you take deep breaths. When coughing or sneezing, hold a pillow firmly against your incision with both  hands. This is called "splinting." Doing this helps protect your incision. It also decreases belly discomfort.  If you are being admitted to the hospital overnight, leave your suitcase in the car. After surgery it may be brought to your room.  If you are being discharged the day of surgery, you will not be allowed to drive home. You will need a responsible adult (18 years or older) to drive you home and stay with you that night.   If you are taking public transportation, you will need to have a responsible adult (18 years or older) with you. Please confirm with your physician that it is acceptable to use public transportation.   Please call the Pre-admissions Testing Dept. at [phone] if you have any questions about these instructions.  Visitation Policy:  Patients undergoing a surgery or procedure may have one family member or support person with them as long as that person is not COVID-19 positive or experiencing its symptoms.  That person may remain in the waiting area during the procedure.  Inpatient Visitation:    Visiting hours are 7 a.m. to 8 p.m. Patients will be allowed one visitor. The visitor may change daily. The visitor must pass COVID-19 screenings, use hand sanitizer when entering and exiting the patient's room and wear a mask at all times, including in the patient's room. Patients must also wear a mask when staff or their visitor are in the room. Masking is required regardless of vaccination status. Systemwide, no visitors 17 or younger.  Visitation Policy Changes: The following changes are to take effect on Feb. 28 at 7 a.m.  No visitors under the age of 32. Any visitor under the age of 57 must be accompanied by an adult. Adult inpatients: Two visitors will be allowed daily and the visitors may change each day during the patient's stay. (Please note -- no changes at this time for the [facility], [facility] and inpatients,  Emergency Departments, Ambulatory Sites, [facility], medical practices and procedural areas.)

## 2020-03-11 ENCOUNTER — Other Ambulatory Visit: Payer: Self-pay

## 2020-03-11 ENCOUNTER — Other Ambulatory Visit
Admission: RE | Admit: 2020-03-11 | Discharge: 2020-03-11 | Disposition: A | Discharge status: Home / Self Care | Payer: Medicare PPO | Source: Ambulatory Visit | Attending: Otolaryngology | Admitting: Otolaryngology

## 2020-03-11 DIAGNOSIS — Z01812 Encounter for preprocedural laboratory examination: Secondary | ICD-10-CM | POA: Insufficient documentation

## 2020-03-11 DIAGNOSIS — Z20822 Contact with and (suspected) exposure to covid-19: Secondary | ICD-10-CM | POA: Insufficient documentation

## 2020-03-12 LAB — SARS CORONAVIRUS 2 (TAT 6-24 HRS): SARS Coronavirus 2: NEGATIVE

## 2020-03-13 ENCOUNTER — Encounter
Admission: RE | Disposition: A | Discharge status: Home / Self Care | Payer: Self-pay | Source: Home / Self Care | Attending: Otolaryngology

## 2020-03-13 ENCOUNTER — Ambulatory Visit
Admission: RE | Admit: 2020-03-13 | Discharge: 2020-03-13 | Disposition: A | Discharge status: Home / Self Care | Payer: Medicare PPO | Attending: Otolaryngology | Admitting: Otolaryngology

## 2020-03-13 ENCOUNTER — Ambulatory Visit: Payer: Medicare PPO | Admitting: Urgent Care

## 2020-03-13 ENCOUNTER — Other Ambulatory Visit: Payer: Self-pay

## 2020-03-13 ENCOUNTER — Encounter: Payer: Self-pay | Admitting: Otolaryngology

## 2020-03-13 DIAGNOSIS — H6981 Other specified disorders of Eustachian tube, right ear: Secondary | ICD-10-CM | POA: Diagnosis not present

## 2020-03-13 DIAGNOSIS — H6521 Chronic serous otitis media, right ear: Secondary | ICD-10-CM | POA: Insufficient documentation

## 2020-03-13 DIAGNOSIS — Z87891 Personal history of nicotine dependence: Secondary | ICD-10-CM | POA: Insufficient documentation

## 2020-03-13 DIAGNOSIS — Z79899 Other long term (current) drug therapy: Secondary | ICD-10-CM | POA: Insufficient documentation

## 2020-03-13 DIAGNOSIS — Z7951 Long term (current) use of inhaled steroids: Secondary | ICD-10-CM | POA: Insufficient documentation

## 2020-03-13 HISTORY — PX: MYRINGOTOMY WITH TUBE PLACEMENT: SHX5663

## 2020-03-13 SURGERY — MYRINGOTOMY WITH TUBE PLACEMENT
Anesthesia: General | Laterality: Right

## 2020-03-13 MED ORDER — CHLORHEXIDINE GLUCONATE 0.12 % MT SOLN
15.0000 mL | Freq: Once | OROMUCOSAL | Status: AC
  Administered 2020-03-13: 15 mL via OROMUCOSAL

## 2020-03-13 MED ORDER — PROPOFOL 10 MG/ML IV BOLUS
INTRAVENOUS | Status: AC
  Filled 2020-03-13: qty 20

## 2020-03-13 MED ORDER — ORAL CARE MOUTH RINSE: 15.0000 mL | Freq: Once | OROMUCOSAL | Status: AC

## 2020-03-13 MED ORDER — FENTANYL CITRATE (PF) 100 MCG/2ML IJ SOLN: 25.0000 ug | INTRAMUSCULAR | Status: DC | PRN

## 2020-03-13 MED ORDER — FENTANYL CITRATE (PF) 100 MCG/2ML IJ SOLN
INTRAMUSCULAR | Status: AC
  Filled 2020-03-13: qty 2

## 2020-03-13 MED ORDER — CIPROFLOXACIN-DEXAMETHASONE 0.3-0.1 % OT SUSP
OTIC | Status: DC | PRN
  Administered 2020-03-13: 4 [drp] via OTIC

## 2020-03-13 MED ORDER — PROPOFOL 10 MG/ML IV BOLUS
INTRAVENOUS | Status: DC | PRN
  Administered 2020-03-13 (×2): 20 mg via INTRAVENOUS
  Administered 2020-03-13: 50 mg via INTRAVENOUS
  Administered 2020-03-13 (×2): 20 mg via INTRAVENOUS

## 2020-03-13 MED ORDER — LIDOCAINE HCL (PF) 2 % IJ SOLN
INTRAMUSCULAR | Status: AC
  Filled 2020-03-13: qty 5

## 2020-03-13 MED ORDER — LACTATED RINGERS IV SOLN: INTRAVENOUS | Status: DC

## 2020-03-13 MED ORDER — CHLORHEXIDINE GLUCONATE 0.12 % MT SOLN
OROMUCOSAL | Status: AC
  Filled 2020-03-13: qty 15

## 2020-03-13 MED ORDER — DEXAMETHASONE SODIUM PHOSPHATE 10 MG/ML IJ SOLN
INTRAMUSCULAR | Status: AC
  Filled 2020-03-13: qty 1

## 2020-03-13 MED ORDER — ONDANSETRON HCL 4 MG/2ML IJ SOLN
INTRAMUSCULAR | Status: AC
  Filled 2020-03-13: qty 2

## 2020-03-13 MED ORDER — FENTANYL CITRATE (PF) 100 MCG/2ML IJ SOLN
INTRAMUSCULAR | Status: DC | PRN
  Administered 2020-03-13 (×2): 25 ug via INTRAVENOUS

## 2020-03-13 SURGICAL SUPPLY — 9 items
BLADE MYRINGOTOMY SICKLE W/HDL (BLADE) ×2 IMPLANT
Butterfly tube ×2 IMPLANT
CANISTER SUCT 1200ML W/VALVE (MISCELLANEOUS) ×2 IMPLANT
COTTON BALL STRL MEDIUM (GAUZE/BANDAGES/DRESSINGS) ×2 IMPLANT
GLOVE PROTEXIS LATEX SZ 7.5 (GLOVE) ×2 IMPLANT
MANIFOLD NEPTUNE II (INSTRUMENTS) ×2 IMPLANT
TOWEL OR 17X26 4PK STRL BLUE (TOWEL DISPOSABLE) ×2 IMPLANT
TUBE EAR T 1.27X5.3 BFLY (OTOLOGIC RELATED) ×2 IMPLANT
TUBING CONNECTING 10 (TUBING) ×2 IMPLANT

## 2020-03-13 NOTE — Transfer of Care (Signed)
Immediate Anesthesia Transfer of Care Note  Patient: BO TEICHER  Procedure(s) Performed: MYRINGOTOMY WITH BUTTERFLY TUBE PLACEMENT (Right )  Patient Location: PACU  Anesthesia Type:General  Level of Consciousness: drowsy  Airway & Oxygen Therapy: Patient Spontanous Breathing and Patient connected to face mask oxygen  Post-op Assessment: Report given to RN and Post -op Vital signs reviewed and stable  Post vital signs: Reviewed and stable  Last Vitals:  Vitals Value Taken Time  BP 120/78 03/13/20 1220  Temp    Pulse 72 03/13/20 1223  Resp 17 03/13/20 1223  SpO2 97 % 03/13/20 1223  Vitals shown include unvalidated device data.  Last Pain:  Vitals:   03/13/20 1042  PainSc: 2          Complications: No complications documented.

## 2020-03-13 NOTE — Discharge Instructions (Signed)

## 2020-03-13 NOTE — Op Note (Signed)
03/13/2020  12:16 PM    Dossie Der  [id number]   Pre-Op Dx: Chronic right serous otitis media because of chronic right eustachian tube dysfunction  Post-op Dx: Same  Proc: Right myringotomy with butterfly tube  Surg: Cammy Copa  Anes:  General by mask  EBL:  None  Comp: None  Findings: The right eardrum was retracted with very thick fluid filling the middle ear space.  This was suctioned clear and a butterfly tube was placed into an inferior radial incision.  Procedure: With the patient in a comfortable supine position, general mask anesthesia was administered.  At an appropriate level, microscope and speculum were used to examine and clean the RIGHT ear canal.  The findings were as described above.  An anterior inferior radial myringotomy incision was sharply executed.  Middle ear contents were suctioned clear.  A butterfly tube was placed without difficulty.  Ciprodex otic solution was instilled into the external canal, and insufflated into the middle ear.  A cotton ball was placed at the external meatus. Hemostasis was observed.  This side was completed. The patient tolerated the procedure well.  There were no operative complications  Following this  The patient was returned to anesthesia, awakened, and transferred to recovery in stable condition.  Dispo:  PACU to home  Plan: Routine drop use and water precautions.  Recheck my office two to three weeks with audiogram.   Cammy Copa 12:16 PM 03/13/2020

## 2020-03-13 NOTE — Anesthesia Preprocedure Evaluation (Addendum)
Anesthesia Evaluation  Patient identified by MRN, date of birth, ID band Patient awake    Reviewed: Allergy & Precautions, H&P , NPO status , Patient's Chart, lab work & pertinent test results  History of Anesthesia Complications Negative for: history of anesthetic complications  Airway Mallampati: II  TM Distance: >3 FB    Comment: Large mustache Dental  (+) Teeth Intact   Pulmonary asthma , neg sleep apnea, COPD, former smoker,    breath sounds clear to auscultation       Cardiovascular hypertension, (-) angina(-) Past MI and (-) Cardiac Stents (-) dysrhythmias  Rhythm:regular Rate:Normal     Neuro/Psych negative neurological ROS  negative psych ROS   GI/Hepatic Neg liver ROS, GERD  ,  Endo/Other  negative endocrine ROS  Renal/GU negative Renal ROS  negative genitourinary   Musculoskeletal   Abdominal   Peds  Hematology negative hematology ROS (+)   Anesthesia Other Findings Past Medical History: No date: Asthma No date: Complication of anesthesia     Comment:  reaction to anesthesia- general No date: COPD (chronic obstructive pulmonary disease) (HCC) No date: GERD (gastroesophageal reflux disease) No date: Hypertension No date: Pneumonia  Past Surgical History: No date: COLONOSCOPY No date: ESOPHAGOGASTRODUODENOSCOPY No date: FRACTURE SURGERY     Comment:  both ankles 2016: HERNIA REPAIR; Right     Comment:  [facility] 04/23/2016: INGUINAL HERNIA REPAIR; Left     Comment:  Large Ultra Pro mesh.  Surgeon: Earline Mayotte, MD;                Location: [facility] ORS;  Service: General;  Laterality: Left; 2000: SINUS EXPLORATION No date: TONSILLECTOMY     Reproductive/Obstetrics negative OB ROS                            Anesthesia Physical Anesthesia Plan  ASA: II  Anesthesia Plan: General   Post-op Pain Management:    Induction:   PONV Risk Score and Plan:   Airway  Management Planned: Mask  Additional Equipment:   Intra-op Plan:   Post-operative Plan:   Informed Consent: I have reviewed the patients History and Physical, chart, labs and discussed the procedure including the risks, benefits and alternatives for the proposed anesthesia with the patient or authorized representative who has indicated his/her understanding and acceptance.     Dental Advisory Given  Plan Discussed with: Anesthesiologist, CRNA and Surgeon  Anesthesia Plan Comments:         Anesthesia Quick Evaluation

## 2020-03-13 NOTE — H&P (Signed)
H&P has been reviewed and patient reevaluated, no changes necessary. To be downloaded later.  

## 2020-03-14 ENCOUNTER — Encounter: Payer: Self-pay | Admitting: Otolaryngology

## 2020-03-14 NOTE — Anesthesia Postprocedure Evaluation (Signed)
Anesthesia Post Note  Patient: Erik Underwood  Procedure(s) Performed: MYRINGOTOMY WITH BUTTERFLY TUBE PLACEMENT (Right )  Patient location during evaluation: PACU Anesthesia Type: General Level of consciousness: awake and alert Pain management: pain level controlled Vital Signs Assessment: post-procedure vital signs reviewed and stable Respiratory status: spontaneous breathing, nonlabored ventilation and respiratory function stable Cardiovascular status: blood pressure returned to baseline and stable Postop Assessment: no apparent nausea or vomiting Anesthetic complications: no   No complications documented.   Last Vitals:  Vitals:   03/13/20 1245 03/13/20 1300  BP: 128/76 125/83  Pulse: 62 63  Resp: 17 14  Temp:  36.9 C  SpO2: 94% 93%    Last Pain:  Vitals:   03/13/20 1300  PainSc: 0-No pain                 Aurelio Brash Ramsdell

## 2020-04-26 ENCOUNTER — Other Ambulatory Visit (HOSPITAL_COMMUNITY): Payer: Self-pay | Admitting: Specialist

## 2020-04-26 ENCOUNTER — Other Ambulatory Visit: Payer: Self-pay | Admitting: Specialist

## 2020-04-26 DIAGNOSIS — J849 Interstitial pulmonary disease, unspecified: Secondary | ICD-10-CM

## 2020-07-18 ENCOUNTER — Ambulatory Visit: Admission: RE | Admit: 2020-07-18 | Payer: Medicare PPO | Source: Ambulatory Visit

## 2022-06-18 IMAGING — CT CT CHEST W/O CM
1 of 3 series · 14 of 33 positions shown, 18 images · non-contrast
Comparison: 05/26/2019, 03/22/2018, 11/29/2012, 10/19/2011

CLINICAL DATA: History of interstitial pulmonary disease, follow-up
prior examinations

EXAM:
CT CHEST WITHOUT CONTRAST
TECHNIQUE: Multidetector CT imaging of the chest was performed following the
standard protocol without IV contrast.

[Series 2: thorax · axial · 0.74mm/px · z∈[-626,-336]mm · 14 of 159 slices shown, 18 images]
[im 7/159  mediastinal]
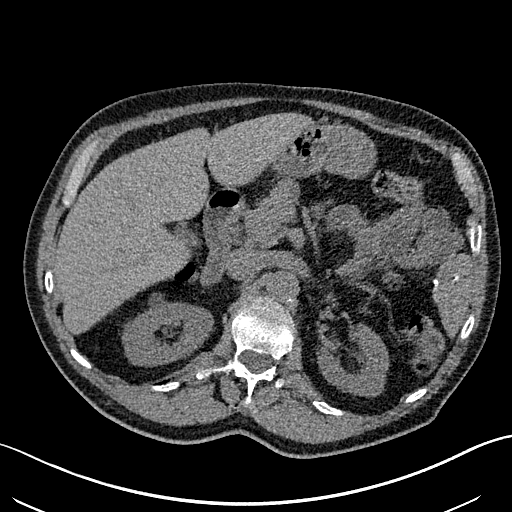
[im 7/159  lung]
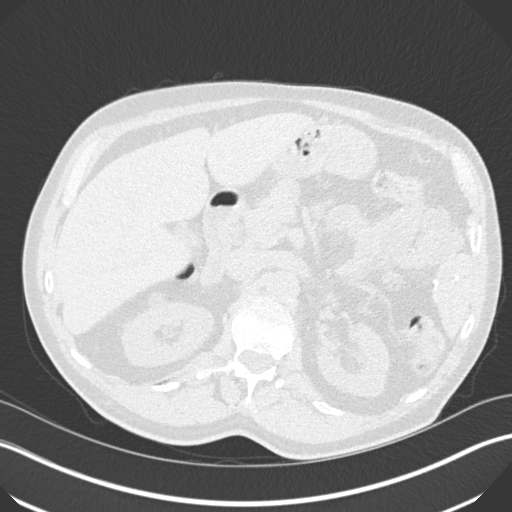
[im 21/159  lung]
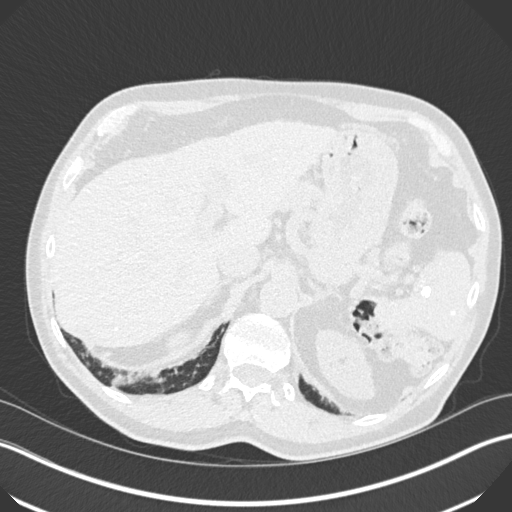
[im 35/159  lung]
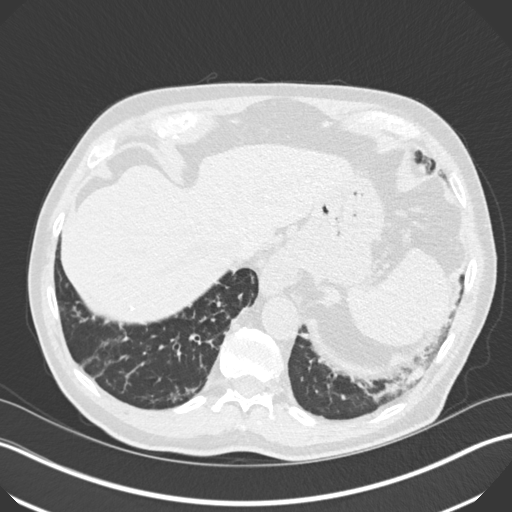
[im 49/159  lung]
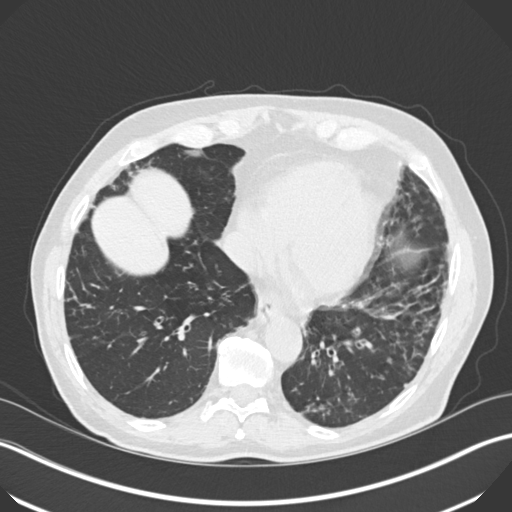
[im 55/159  mediastinal]
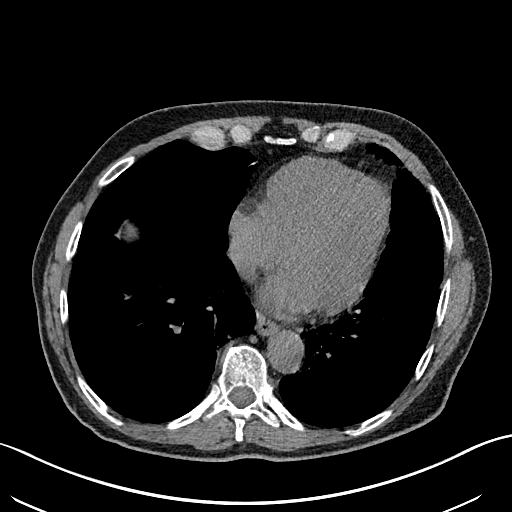
[im 55/159  lung]
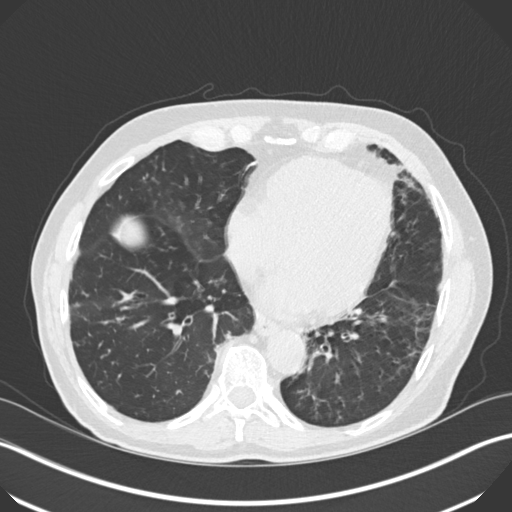
[im 62/159  lung]
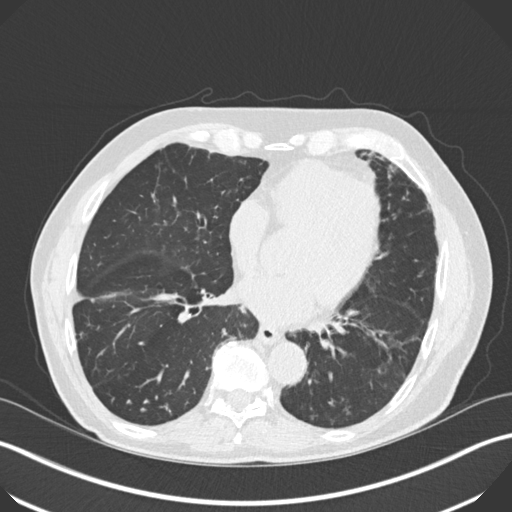
[im 75/159  lung]
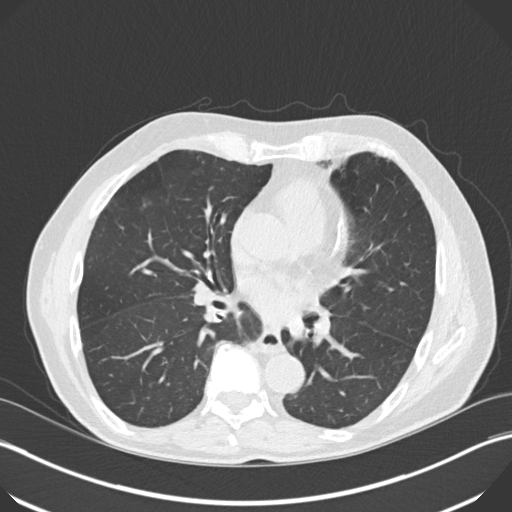
[im 83/159  lung]
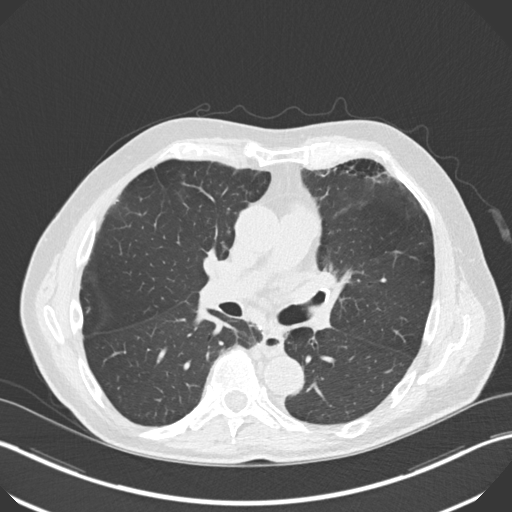
[im 97/159  mediastinal]
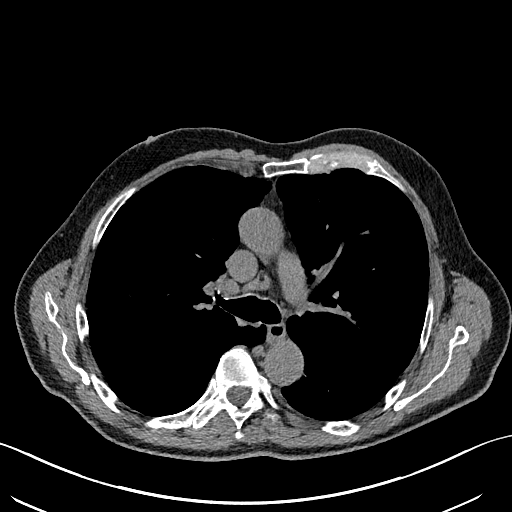
[im 97/159  lung]
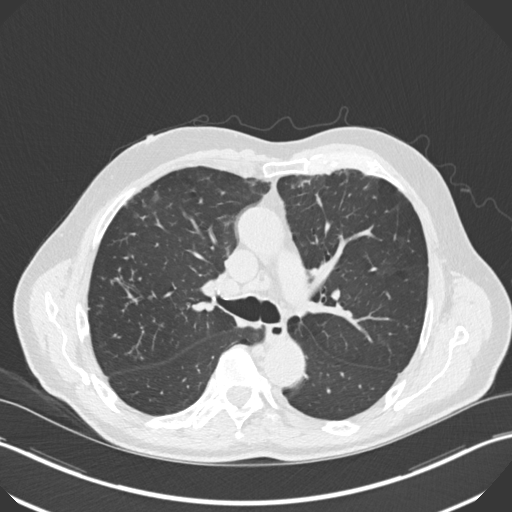
[im 104/159  lung]
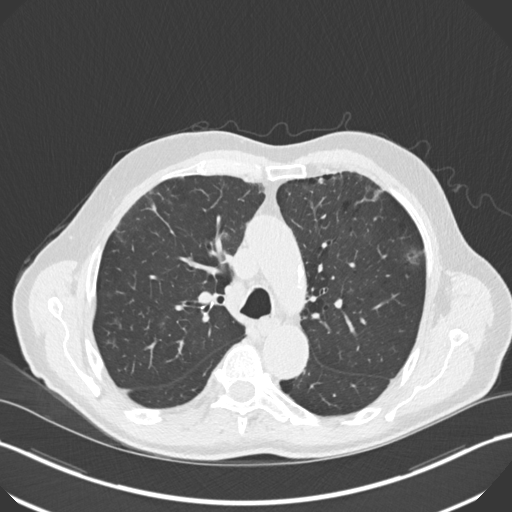
[im 110/159  lung]
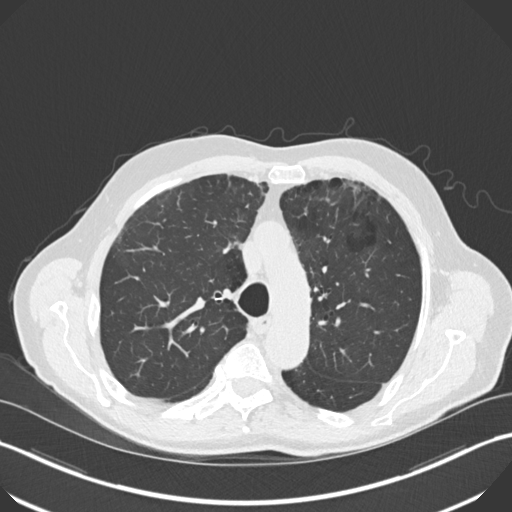
[im 124/159  lung]
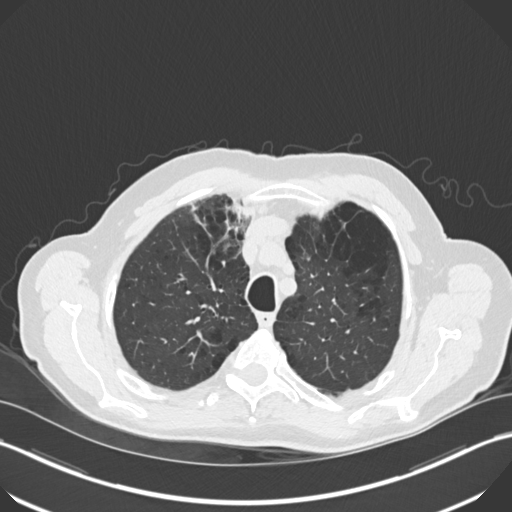
[im 138/159  mediastinal]
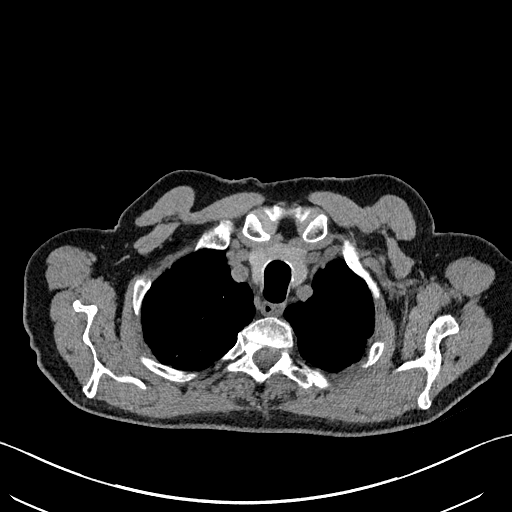
[im 138/159  lung]
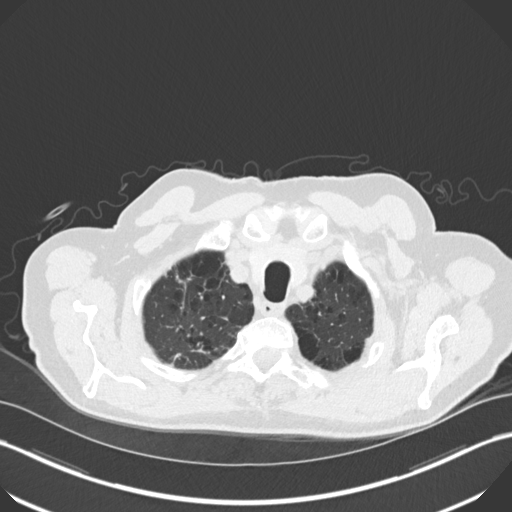
[im 152/159  lung]
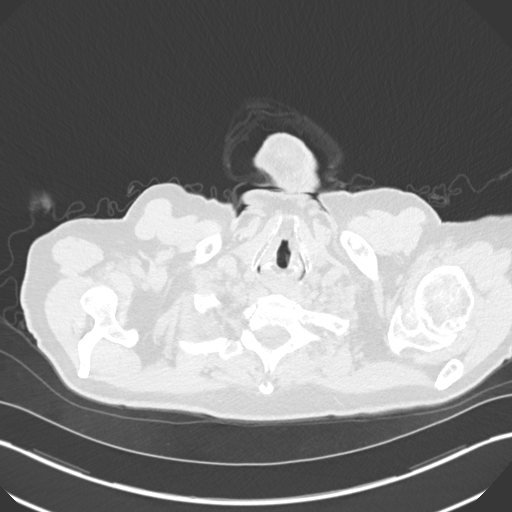

[14 of 33 positions shown; findings below may reference images not displayed]

FINDINGS: Cardiovascular: Aortic atherosclerosis. Normal heart size.
Three-vessel coronary artery calcifications. No pericardial
effusion.

Mediastinum/Nodes: No enlarged mediastinal, hilar, or axillary lymph
nodes. Thyroid gland, trachea, and esophagus demonstrate no
significant findings.

Lungs/Pleura: Moderate centrilobular emphysema. Bronchial wall
thickening in the lower lobes. There is bibasilar atelectasis and/or
scarring, with clustered centrilobular and tree-in-bud nodularity
throughout the lung bases, most conspicuous in the right lower lobe
(series 5, image 260). Multiple small bilateral pulmonary nodules,
stable compared to prior examinations dating back to 8579 and
definitively benign, for example a 6 mm nodule of the right upper
lobe (series 5, image 84). Previously seen acute ground-glass
airspace opacity about the periphery of the lungs is generally
resolved, with minimal residua. No pleural effusion or pneumothorax.

Upper Abdomen: No acute abnormality.

Musculoskeletal: No chest wall mass or suspicious bone lesions
identified.
IMPRESSION: 1. There is bibasilar atelectasis and/or scarring, with clustered
centrilobular and tree-in-bud nodularity throughout the lung bases,
most conspicuous in the right lower lobe.
2. Bronchial wall thickening lower lobes.
3. These findings most likely reflect chronic sequelae of
recurrent/ongoing aspiration and atypical infection, including
atypical mycobacteria.
4. Previously seen acute ground-glass airspace opacity about the
periphery of the lungs is generally resolved, with minimal residua.
5. Multiple small bilateral pulmonary nodules, stable compared to
prior examinations dating back to 8579 and definitively benign.
6. Emphysema ([id number]).
7. Coronary artery disease. Aortic Atherosclerosis ([id number]).
